# Patient Record
Sex: Male | Born: 1980 | Race: White | Hispanic: No | Marital: Single
Health system: Southern US, Community
[De-identification: ages and names within clinical notes are randomized; demographics above are authoritative.]

---

## 2005-02-19 ENCOUNTER — Emergency Department: Payer: Self-pay | Admitting: Emergency Medicine

## 2005-03-07 ENCOUNTER — Emergency Department: Payer: Self-pay | Admitting: Emergency Medicine

## 2008-05-08 ENCOUNTER — Emergency Department: Payer: Self-pay | Admitting: Emergency Medicine

## 2008-11-07 ENCOUNTER — Emergency Department: Payer: Self-pay | Admitting: Emergency Medicine

## 2009-01-25 ENCOUNTER — Emergency Department: Payer: Self-pay | Admitting: Emergency Medicine

## 2009-06-30 ENCOUNTER — Emergency Department: Payer: Self-pay | Admitting: Unknown Physician Specialty

## 2009-12-15 ENCOUNTER — Emergency Department: Payer: Self-pay | Admitting: Emergency Medicine

## 2011-09-11 IMAGING — CR DG CHEST 2V
1 series · 3 of 3 positions shown · non-contrast
Comparison: none

REASON FOR EXAM: cp
COMMENTS:

PROCEDURE:     DXR - DXR CHEST PA (OR AP) AND LATERAL  - December 15, 2009  [DATE]
RESULT:     The lungs are adequately inflated. There are nipple rings
present bilaterally. The cardiac silhouette is normal in size. The pulmonary
vascularity is not engorged. There is no pleural effusion.

[Series 1: view not recorded · 0.17mm/px · 3 of 3 slices shown]
[im 1/3]
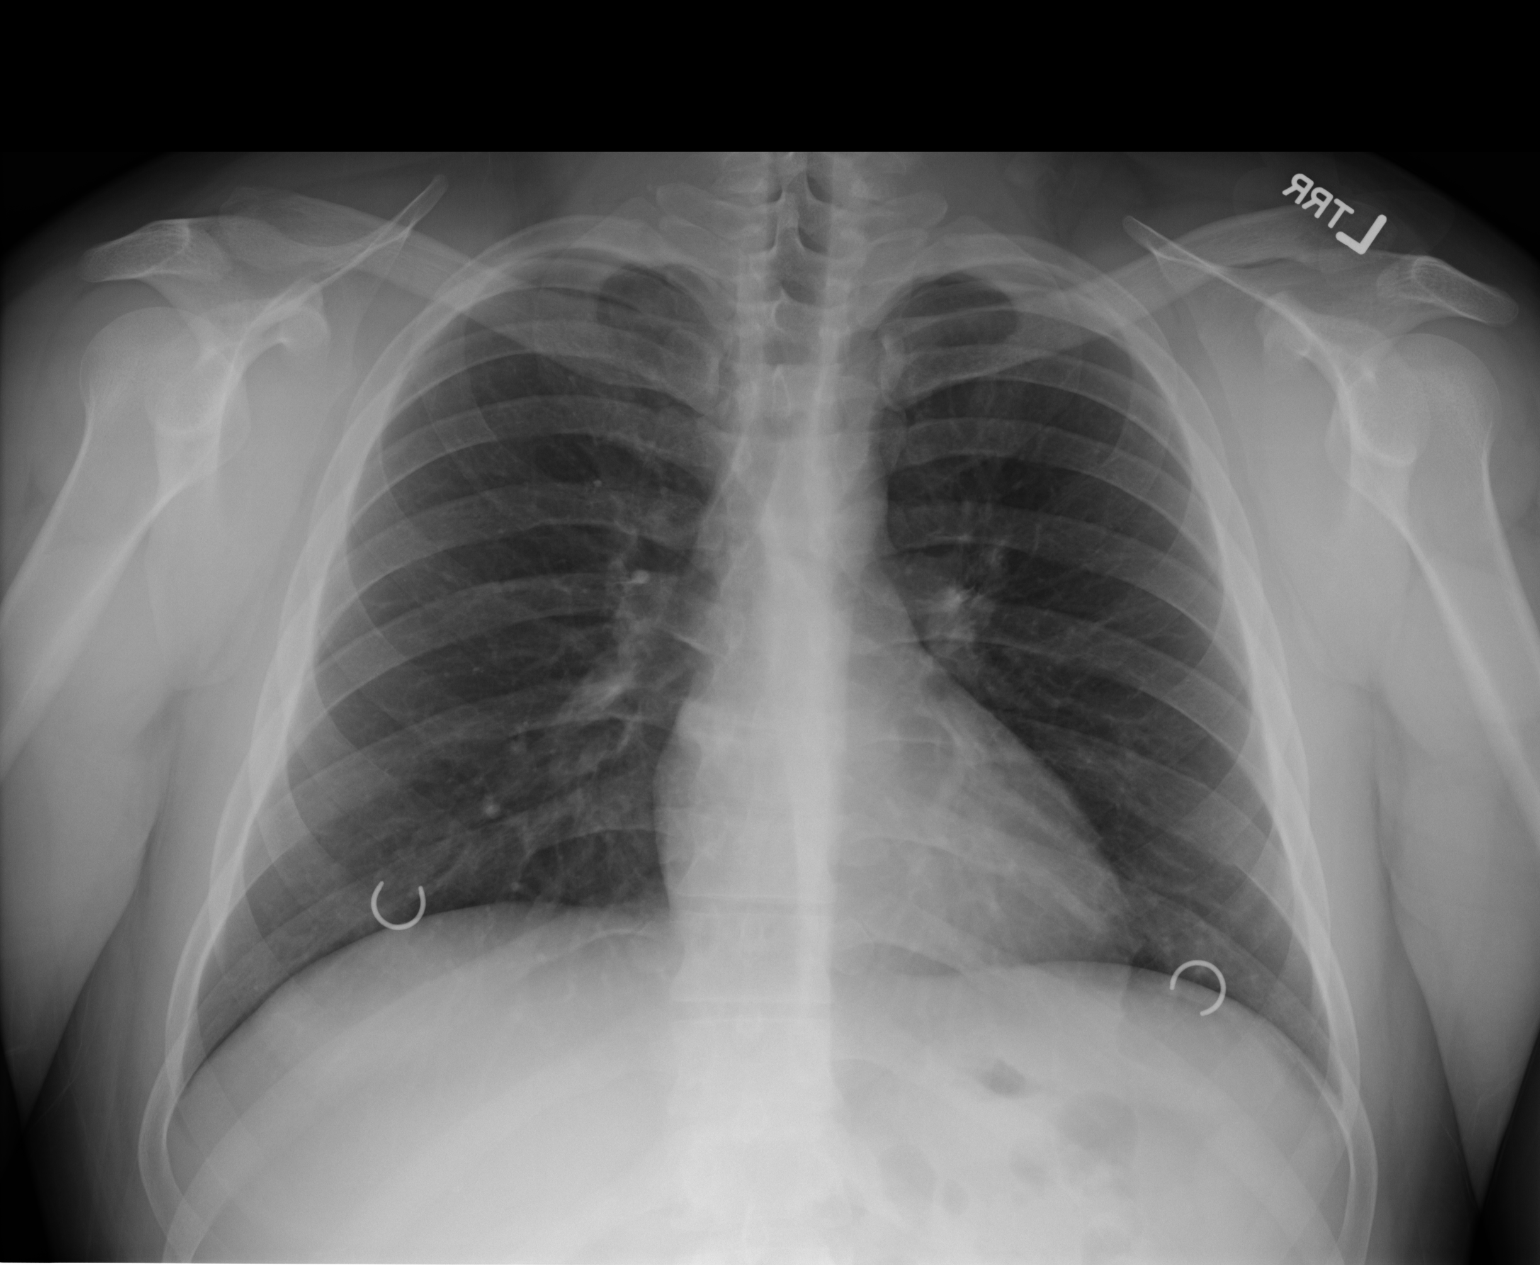
[im 2/3]
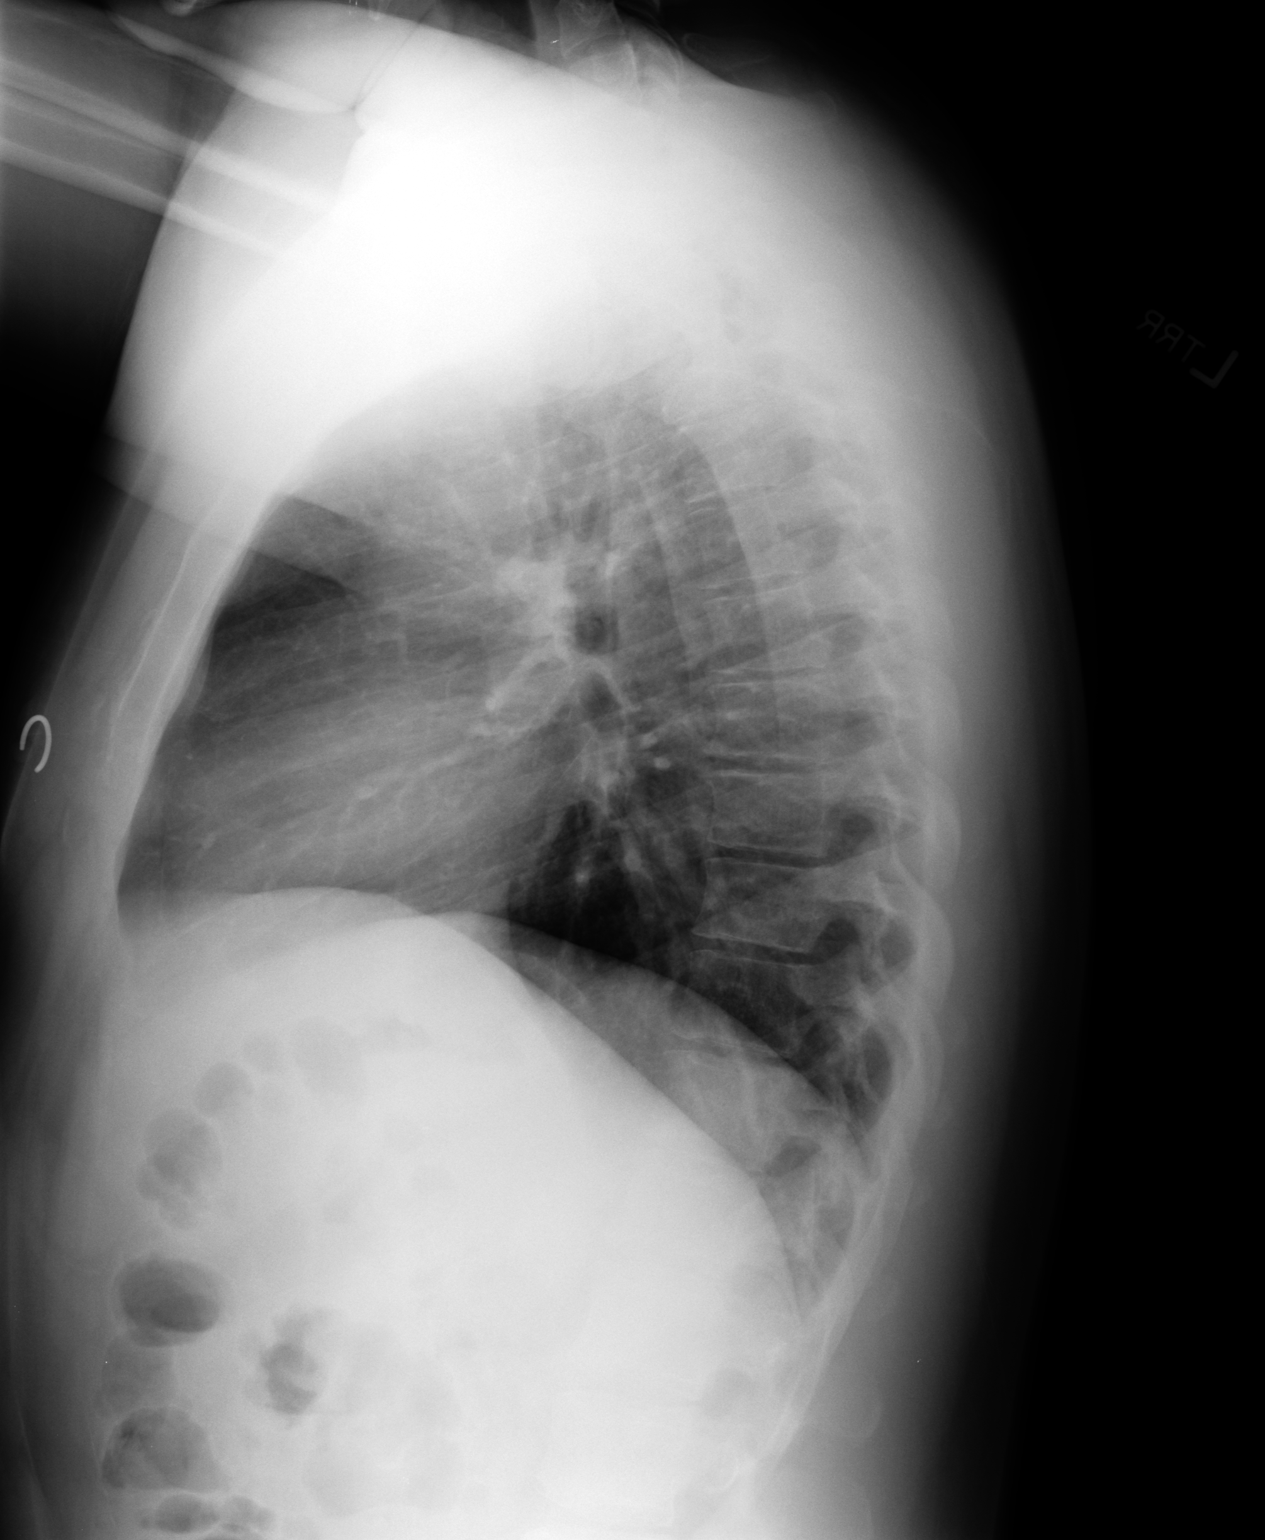
[im 3/3]
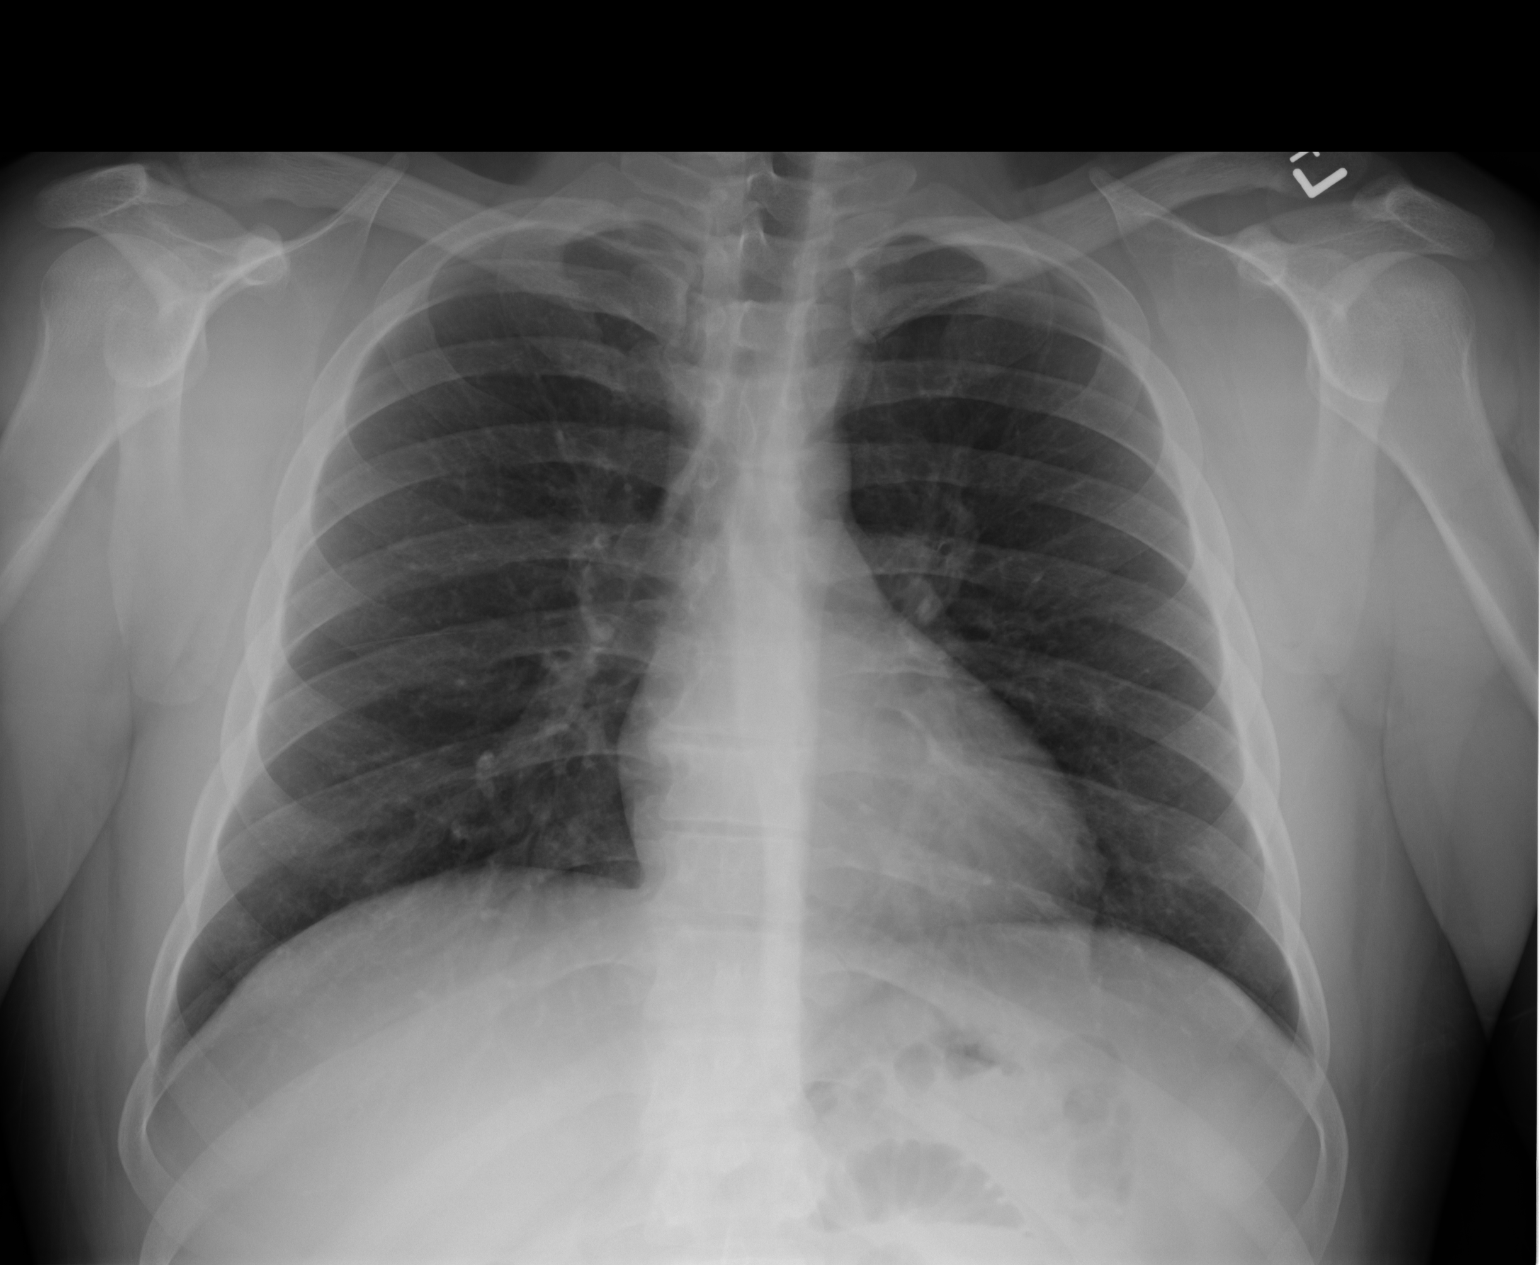

[3 of 3 positions shown; findings below may reference images not displayed]

IMPRESSION: I do not see evidence of acute cardiopulmonary abnormality.

## 2013-04-06 ENCOUNTER — Emergency Department: Payer: Self-pay | Admitting: Psychiatry

## 2015-01-11 ENCOUNTER — Ambulatory Visit: Payer: Self-pay | Admitting: Orthopedic Surgery

## 2015-01-13 ENCOUNTER — Ambulatory Visit: Payer: Self-pay | Admitting: Orthopedic Surgery

## 2015-03-07 NOTE — Op Note (Signed)
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   POSTOPERATIVE DIAGNOSIS: Right knee synovitis, medial plica and Hoffa's fat pad impingement.   PROCEDURE: Right knee arthroscopic debridement of Hoffa's fat pad, debridement of medial plica, and synovectomy.   SURGEON: Timoteo Gaul, MD   ANESTHESIA: General.   ESTIMATED BLOOD LOSS: Minimal.   COMPLICATIONS: None.   INDICATIONS FOR PROCEDURE: The patient is a 34 year old male who injured his right knee at work. He sustained a hyperextension injury. He has had pain for over 13 months and has not responded to nonoperative management. He wished to proceed with arthroscopic debridement of Hoffa's fat pad and synovectomy.   I have reviewed the risks and benefits of the surgery with the patient in my office prior to the day of surgery. He understands the risks to include infection, bleeding, nerve or blood vessel injury, knee stiffness, persistent pain, and the need for further surgery. Medical risks include, but are not limited to DVT and pulmonary embolism, myocardial infarction, stroke, pneumonia, respiratory failure, and death. The patient understood these risks and wished to proceed.   PROCEDURE NOTE: The patient was met in the preoperative area. His right knee was signed with the word "yes" and my initials, according to the hospital's correct site of surgery protocol. He was then brought to the operating room, where he was placed supine on the operative table. He was prepped and draped in sterile fashion. A tourniquet was applied to the right thigh, but it was not inflated during the case. Once he was prepped and draped in a sterile fashion, a timeout was performed to verify the patient's name, date of birth, medical record number, correct site of surgery, and correct procedure to be performed. It was  also used to verify the patient had received antibiotics and that all appropriate instruments, implants, and radiographic studies were available in the room. Once all in attendance were in agreement, the case began.   The patient had proposed arthroscopy incisions drawn out with a surgical marker based upon bony landmarks. These were pre-injected with 1% lidocaine plain. An 11 blade was used to establish inferolateral and inferomedial portals. The inferomedial portal was established under direct visualization, using an 18-gauge spinal needle for localization. The arthroscope was placed into the inferolateral portal. The knee was brought into full extension. A full diagnostic examination of the knee was undertaken, including the suprapatellar pouch, the patellofemoral joint, the medial and lateral gutters, the medial and lateral compartments, the intercondylar notch, and the posterior knee. Findings on arthroscopy included abundant synovitis and Hoffa's fat pad and scarring of Hoffa's fat pad. 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 The patient had no medial or lateral meniscal tears. His ACL was intact, and there were no loose bodies seen.   A 4.0 resector shaver blade was placed through the inferomedial portal. The Hoffa's fat pad was debrided. A 90 degrees ArthroCare wand was then placed through the inferomedial portal. The bleeding vessels were cauterized. Synovectomy was also performed in the medial compartment, including the medial gutter. The arthroscope was then placed into the inferomedial portal and the ArthroCare wand was placed through the inferolateral portal and a synovectomy of the anterior to lateral compartment and the lateral gutter was performed. The arthroscope was then placed  back through the inferolateral portal. A 4.0 resector shaver blade was then used to debride the medial plica. The 90  degrees ArthroCare wand was also used to complete debridement of the medial plica. The knee was then copiously irrigated. All soft tissue debris was removed. The knee was copiously lavaged, and all arthroscopic instruments were removed. The 2 arthroscopic incisions were closed with 4-0 nylon. A dry sterile and compressive dressing was applied over the Steri-Strips and Xeroform covering the incisions. The patient was awakened and brought to the PACU in stable condition. I was scrubbed and present for the entire case, and all sharp and instrument counts were correct at the conclusion of the case. I spoke with the patient's mother postoperatively to let her know the case had gone without complication. The patient was stable in the recovery room.     ____________________________ Timoteo Gaul, MD [id number] D: 01/18/2015 12:12:50 ET T: 01/18/2015 12:44:49 ET JOB#: [id number]  cc: Timoteo Gaul, MD, <Dictator> Timoteo Gaul MD ELECTRONICALLY SIGNED 01/26/2015 9:31

## 2015-07-23 ENCOUNTER — Emergency Department
Admission: EM | Admit: 2015-07-23 | Discharge: 2015-07-23 | Disposition: A | Discharge status: Home / Self Care | Payer: Self-pay | Attending: Emergency Medicine | Admitting: Emergency Medicine

## 2015-07-23 DIAGNOSIS — L989 Disorder of the skin and subcutaneous tissue, unspecified: Secondary | ICD-10-CM | POA: Insufficient documentation

## 2015-07-23 MED ORDER — MUPIROCIN CALCIUM 2 % EX CREA: TOPICAL_CREAM | CUTANEOUS | Status: AC

## 2015-07-23 MED ORDER — CEPHALEXIN 500 MG PO CAPS: 500.0000 mg | ORAL_CAPSULE | Freq: Three times a day (TID) | ORAL | Status: AC

## 2015-07-23 NOTE — ED Provider Notes (Addendum)
Patient was seen by me, he has a large growth in his left inguinal area. There is slight bleeding to the anterior aspect of the lesion. This is likely from friction, however we'll cover with antibiotics and antibiotic ointment. Surprising that he is allowed this growth to become this large, he is advised to follow-up with surgery to have it removed. Again this point all we can do is give him oral and topical antibiotic ointment. He stable for outpatient follow-up  Medical screening examination/treatment/procedure(s) were performed by non-physician practitioner and as supervising physician I was immediately available for consultation/collaboration.    Zavia Pullen Filbert, MD 07/23/15 1610  Shawon Denzer Filbert, MD 07/23/15 [phone]

## 2015-07-23 NOTE — ED Notes (Signed)
4x6 cm skin growth to left side of inguinal area, pt has been seen by several different doctors about this.  Was told not to worry about it.

## 2015-07-23 NOTE — ED Notes (Signed)
Skin tag on the left upper leg started bleeding at work. Pt reports "pin hole squirting blood" while at work with no precipitating movements or events. Pt got bleeding to stop after 45 mins. Not currently bleeding. No pain reported.

## 2015-07-23 NOTE — Discharge Instructions (Signed)
Please call [facility] and ask about the charity care program. This lesion needs to be removed as soon as possible.

## 2015-08-13 NOTE — ED Provider Notes (Signed)
[facility] Emergency Department Provider Note ____________________________________________  Time seen: Approximately 09:05 AM  I have reviewed the triage vital signs and the nursing notes.   HISTORY  Chief Complaint Abrasion   HPI Douglas Bates is a 34 y.o. male who presents to the emergency department for reevaluation of a skin lesion. He states that the lesion has been present for "a very long time" but he has not been able to see a surgeon to have it removed as has been recommended. He states that for the past few days the inner aspect of the lesion has been irritated and bleeding. He denies pain or purulent drainage in the area.   No past medical history on file.  There are no active problems to display for this patient.   No past surgical history on file.  Current Outpatient Rx  Name  Route  Sig  Dispense  Refill  . mupirocin cream (BACTROBAN) 2 %      Apply to affected area 3 times daily   30 g   0     Allergies Review of patient's allergies indicates not on file.  No family history on file.  Social History Social History  Substance Use Topics  . Smoking status: Not on file  . Smokeless tobacco: Not on file  . Alcohol Use: Not on file    Review of Systems   Constitutional: No fever/chills Eyes: No visual changes. ENT: No congestion or rhinorrhea Cardiovascular: Denies chest pain. Respiratory: Denies shortness of breath. Gastrointestinal: No abdominal pain.  No nausea, no vomiting.  No diarrhea.  No constipation. Genitourinary: Negative for dysuria. Musculoskeletal: Negative for back pain. Skin: Lesion on the left inguinal area. Neurological: Negative for headaches, focal weakness or numbness.  10-point ROS otherwise negative.  ____________________________________________   PHYSICAL EXAM:  VITAL SIGNS: ED Triage Vitals  Enc Vitals Group     BP 07/23/15 0858 143/88 mmHg     Pulse Rate 07/23/15 0858 108    Resp 07/23/15 0858 16     Temp 07/23/15 0858 99.1 F (37.3 C)     Temp Source 07/23/15 0858 Oral     SpO2 07/23/15 0858 95 %     Weight 07/23/15 0858 200 lb (90.719 kg)     Height 07/23/15 0858  (1.676 m)     Head Cir --      Peak Flow --      Pain Score --      Pain Loc --      Pain Edu? --      Excl. in GC? --     Constitutional: Alert and oriented. Well appearing and in no acute distress. Eyes: Conjunctivae are normal. PERRL. EOMI. Head: Atraumatic. Nose: No congestion/rhinnorhea. Mouth/Throat: Mucous membranes are moist.  Oropharynx non-erythematous. No oral lesions. Neck: No stridor. Cardiovascular: Normal rate, regular rhythm.  Good peripheral circulation. Respiratory: Normal respiratory effort.  No retractions. Lungs CTAB. Gastrointestinal: Soft and nontender. No distention. No abdominal bruits.  Musculoskeletal: No lower extremity tenderness nor edema.  No joint effusions. Neurologic:  Normal speech and language. No gross focal neurologic deficits are appreciated. Speech is normal. No gait instability. Skin:  Large growth on the left inguinal area with scant bleeding noted on the medial aspect that appears irritated from clothing. No erythema or fluctuance. No evidence of cellulitis in the surrounding area; Negative for petechiae.  Psychiatric: Mood and affect are normal. Speech and behavior are normal.  ____________________________________________   LABS (all labs ordered  are listed, but only abnormal results are displayed)  Labs Reviewed - No data to display ____________________________________________  EKG   ____________________________________________  RADIOLOGY  Not indicated ____________________________________________   PROCEDURES  Procedure(s) performed: None ____________________________________________   INITIAL IMPRESSION / ASSESSMENT AND PLAN / ED COURSE  Pertinent labs & imaging results that were available during my care of the patient  were reviewed by me and considered in my medical decision making (see chart for details).  Patient was strongly advised to follow up with a surgeon or dermatologist as soon as possible. He was advised to return to the emergency department for symptoms of concern. ____________________________________________   FINAL CLINICAL IMPRESSION(S) / ED DIAGNOSES  Final diagnoses:  Skin lesion       Chinita Pester, FNP 08/13/15 1908  Emily Filbert, MD 08/13/15 2218

## 2019-09-19 ENCOUNTER — Other Ambulatory Visit: Payer: Self-pay

## 2019-09-19 DIAGNOSIS — Z20822 Contact with and (suspected) exposure to covid-19: Secondary | ICD-10-CM

## 2019-09-22 ENCOUNTER — Telehealth: Payer: Self-pay

## 2019-09-22 LAB — NOVEL CORONAVIRUS, NAA: SARS-CoV-2, NAA: DETECTED — AB

## 2019-09-22 NOTE — Telephone Encounter (Signed)
Received call from patient checking Covid results.  Advised results are positive.  Advised to contact PCP or visit ED if symptoms worsen.   

## 2019-09-24 ENCOUNTER — Other Ambulatory Visit: Payer: Self-pay

## 2019-09-24 DIAGNOSIS — Z20822 Contact with and (suspected) exposure to covid-19: Secondary | ICD-10-CM

## 2019-09-26 LAB — NOVEL CORONAVIRUS, NAA: SARS-CoV-2, NAA: DETECTED — AB

## 2019-09-30 ENCOUNTER — Other Ambulatory Visit: Payer: Self-pay

## 2019-09-30 ENCOUNTER — Encounter: Payer: Self-pay | Admitting: *Deleted

## 2019-09-30 DIAGNOSIS — Z20822 Contact with and (suspected) exposure to covid-19: Secondary | ICD-10-CM

## 2019-10-02 LAB — NOVEL CORONAVIRUS, NAA: SARS-CoV-2, NAA: NOT DETECTED

## 2019-10-06 ENCOUNTER — Other Ambulatory Visit: Payer: Self-pay

## 2019-10-06 DIAGNOSIS — Z20822 Contact with and (suspected) exposure to covid-19: Secondary | ICD-10-CM

## 2019-10-07 LAB — NOVEL CORONAVIRUS, NAA: SARS-CoV-2, NAA: NOT DETECTED
